# Patient Record
Sex: Male | Born: 1970 | Race: White | Hispanic: No | Marital: Married | Smoking: Never smoker
Health system: Southern US, Community
[De-identification: ages and names within clinical notes are randomized; demographics above are authoritative.]

## PROBLEM LIST (undated history)

## (undated) DIAGNOSIS — N289 Disorder of kidney and ureter, unspecified: Secondary | ICD-10-CM

## (undated) DIAGNOSIS — G43909 Migraine, unspecified, not intractable, without status migrainosus: Secondary | ICD-10-CM

## (undated) DIAGNOSIS — I10 Essential (primary) hypertension: Secondary | ICD-10-CM

## (undated) HISTORY — DX: Migraine, unspecified, not intractable, without status migrainosus: G43.909

## (undated) HISTORY — DX: Disorder of kidney and ureter, unspecified: N28.9

## (undated) HISTORY — PX: HERNIA REPAIR: SHX51

## (undated) HISTORY — DX: Essential (primary) hypertension: I10

---

## 2015-04-07 IMAGING — CR DG CHEST 1V SAME DAY
2 series · 2 of 2 positions shown · non-contrast
Comparison: 04/05/2008

CLINICAL DATA: Scoliosis.

EXAM:
CHEST - 1 VIEW SAME DAY

[PA (1 of 2)]
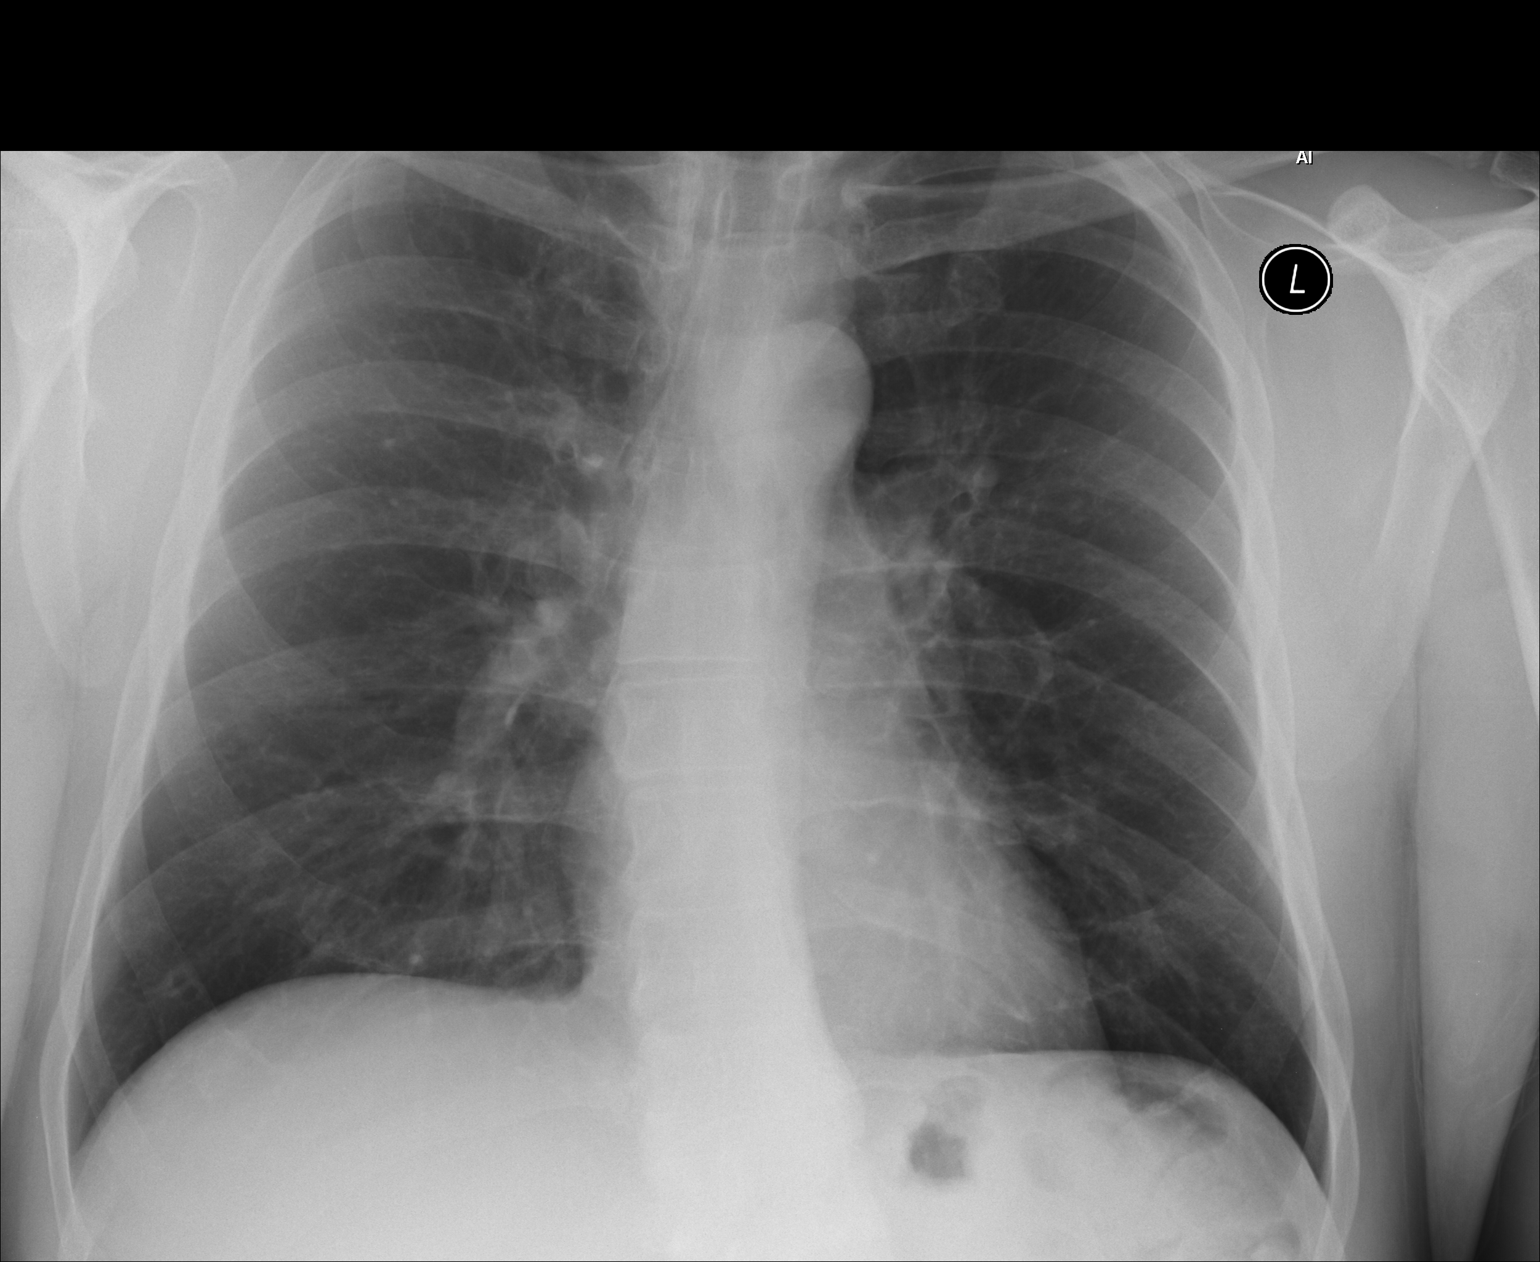

[PA (2 of 2)]
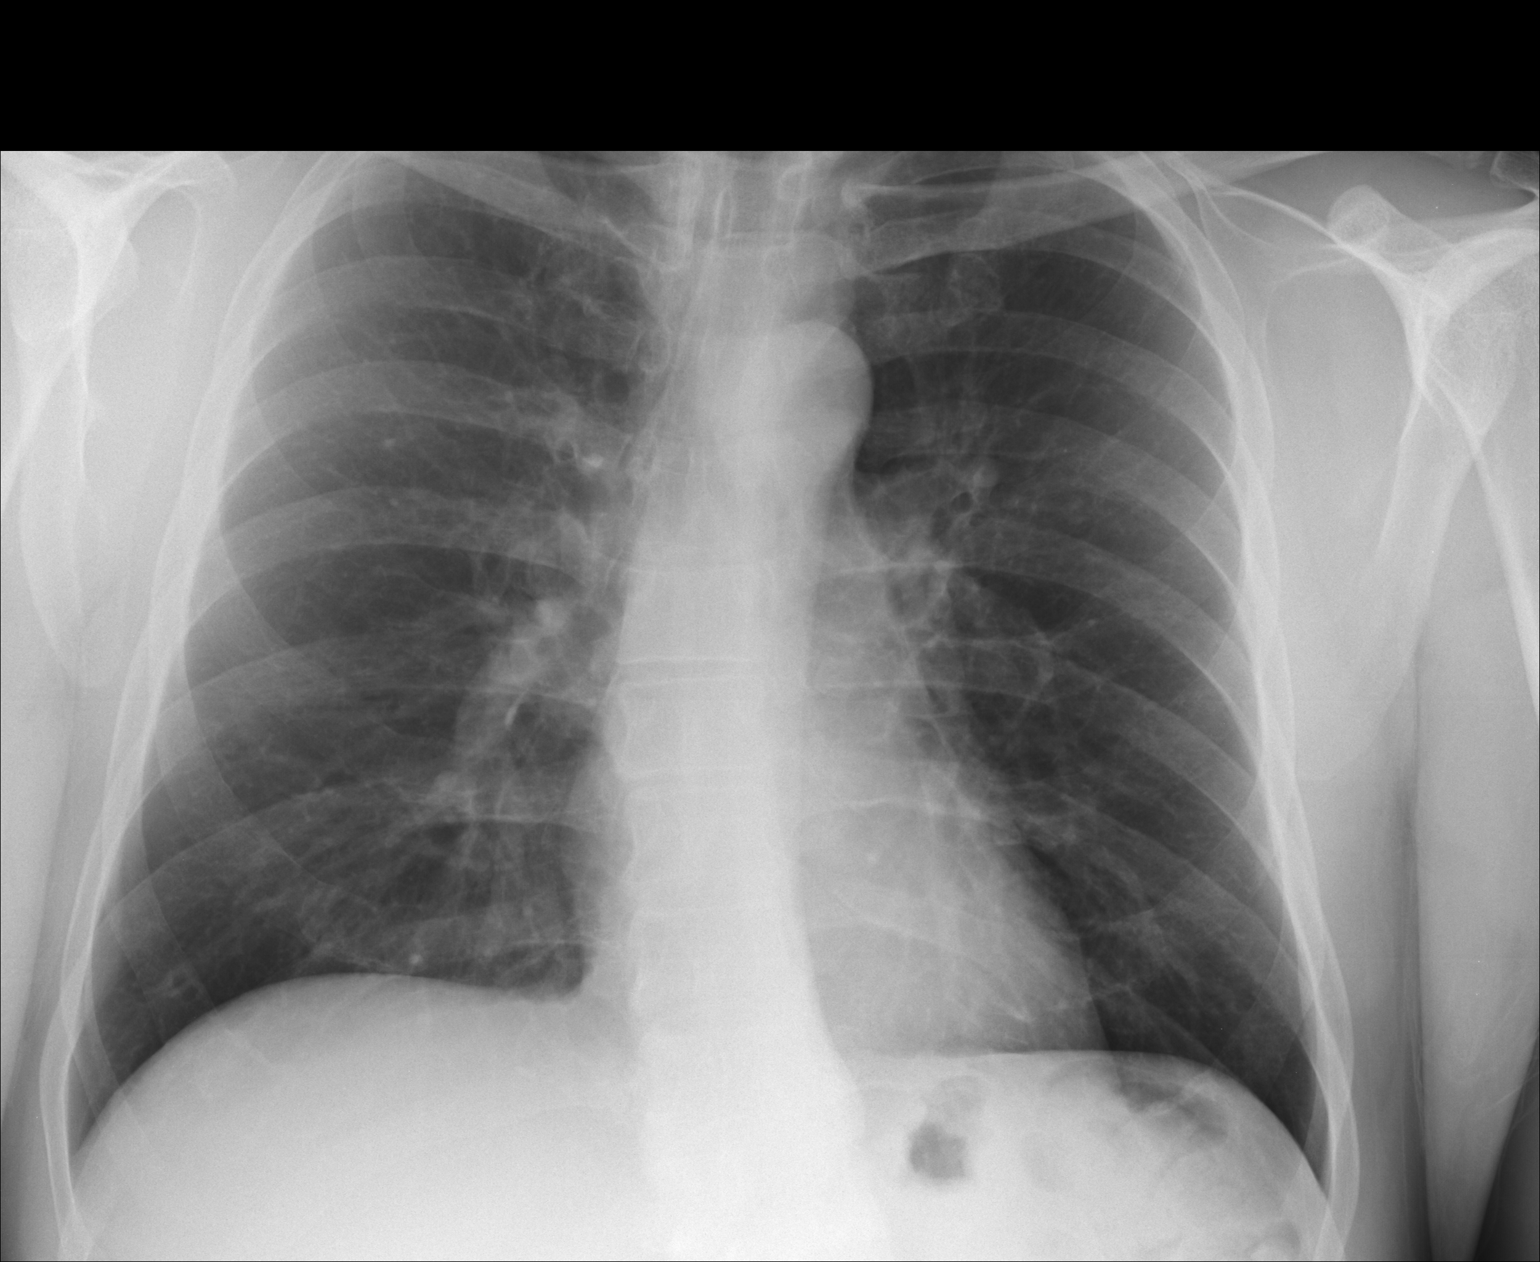

[2 of 2 positions shown; findings below may reference images not displayed]

FINDINGS: There is 16 degrees of dextroconvex thoracic scoliosis as measured
between T8-5 and T10.

Cardiac and mediastinal margins appear normal.

Request is made for comment over a "1 cm nodular area over the right
lower lung." I do not know what this is referring to. Projecting
over the right anterior seventh rib there are several vascular
markings which might resemble a nodule with a lucent center.
IMPRESSION: 1. Mild dextroconvex thoracic scoliosis.
2. I am asked to comment on a right basilar nodule, but I do not see
such a nodule. It might be worthwhile to get a chest CT since I am
not certain what the original interpretation is referring to.

## 2015-11-06 ENCOUNTER — Ambulatory Visit (INDEPENDENT_AMBULATORY_CARE_PROVIDER_SITE_OTHER): Payer: Managed Care, Other (non HMO) | Admitting: Sports Medicine

## 2015-11-06 ENCOUNTER — Ambulatory Visit (INDEPENDENT_AMBULATORY_CARE_PROVIDER_SITE_OTHER): Payer: Managed Care, Other (non HMO)

## 2015-11-06 ENCOUNTER — Encounter: Payer: Self-pay | Admitting: Sports Medicine

## 2015-11-06 DIAGNOSIS — M79674 Pain in right toe(s): Secondary | ICD-10-CM

## 2015-11-06 DIAGNOSIS — M205X1 Other deformities of toe(s) (acquired), right foot: Secondary | ICD-10-CM | POA: Diagnosis not present

## 2015-11-06 DIAGNOSIS — M25571 Pain in right ankle and joints of right foot: Secondary | ICD-10-CM

## 2015-11-06 DIAGNOSIS — M7751 Other enthesopathy of right foot: Secondary | ICD-10-CM

## 2015-11-06 MED ORDER — DICLOFENAC SODIUM 75 MG PO TBEC: 75.0000 mg | DELAYED_RELEASE_TABLET | Freq: Two times a day (BID) | ORAL | Status: DC

## 2015-11-06 MED ORDER — TRIAMCINOLONE ACETONIDE 10 MG/ML IJ SUSP: 10.0000 mg | Freq: Once | INTRAMUSCULAR | Status: AC

## 2015-11-06 NOTE — Patient Instructions (Signed)
Hallux Rigidus Hallux rigidus is a condition involving pain and a loss of motion of the first (big) toe. The pain gets worse with lifting up (extension) of the toe. This is usually due to arthritic bony bumps (spurring) of the joint at the base of the big toe.  SYMPTOMS   Pain, with lifting up of the toe.  Tenderness over the joint where the big toe meets the foot.  Redness, swelling, and warmth over the top of the base of the big toe (sometimes).  Foot pain, stiffness, and limping. CAUSES  Hallux rigidus is caused by arthritis of the joint where the big toe meets the foot. The arthritis creates a bone spur that pinches the soft tissues when the toe is extended. RISK INCREASES WITH:  Tight shoes with a narrow toe box.  Family history of foot problems.  Gout and rheumatoid and psoriatic arthritis.  History of previous toe injury, including "turf toe."  Long first toe, flat feet, and other big toe bony bumps.  Arthritis of the big toe. PREVENTION   Wear wide-toed shoes that fit well.  Tape the big toe to reduce motion and to prevent pinching of the tissues between the bone.  Maintain physical fitness:  Foot and ankle flexibility.  Muscle strength and endurance. PROGNOSIS  This condition can usually be managed with proper treatment. However, surgery is typically required to prevent the problem from recurring.  RELATED COMPLICATIONS  Injury to other areas of the foot or ankle, caused by abnormal walking in an attempt to avoid the pain felt when walking normally. TREATMENT Treatment first involves stopping the activities that aggravate your symptoms. Ice and medicine can be used to reduce the pain and inflammation. Modifications to shoes may help reduce pain, including wearing stiff-soled shoes, shoes with a wide toe box, inserting a padded donut to relieve pressure on top of the joint, or wearing an arch support. Corticosteroid injections may be given to reduce inflammation. If  nonsurgical treatment is unsuccessful, surgery may be needed. Surgical options include removing the arthritic bony spur, cutting a bone in the foot to change the arc of motion (allowing the toe to extend more), or fusion of the joint (eliminating all motion in the joint at the base of the big toe).  MEDICATION   If pain medicine is needed, nonsteroidal anti-inflammatory medicines (aspirin and ibuprofen), or other minor pain relievers (acetaminophen), are often advised.  Do not take pain medicine for 7 days before surgery.  Prescription pain relievers are usually prescribed only after surgery. Use only as directed and only as much as you need.  Ointments for arthritis, applied to the skin, may give some relief.  Injections of corticosteroids may be given to reduce inflammation. HEAT AND COLD  Cold treatment (icing) relieves pain and reduces inflammation. Cold treatment should be applied for 10 to 15 minutes every 2 to 3 hours, and immediately after activity that aggravates your symptoms. Use ice packs or an ice massage.  Heat treatment may be used before performing the stretching and strengthening activities prescribed by your caregiver, physical therapist, or athletic trainer. Use a heat pack or a warm water soak. SEEK MEDICAL CARE IF:   Symptoms get worse or do not improve in 2 weeks, despite treatment.  After surgery you develop fever, increasing pain, redness, swelling, drainage of fluids, bleeding, or increasing warmth.  New, unexplained symptoms develop. (Drugs used in treatment may produce side effects.)   This information is not intended to replace advice given to   you by your health care provider. Make sure you discuss any questions you have with your health care provider.   Document Released: 08/16/2005 Document Revised: 09/06/2014 Document Reviewed: 11/28/2008 Elsevier Interactive Patient Education 2016 [employer]  

## 2015-11-06 NOTE — Progress Notes (Addendum)
Patient ID: Cody Lee, male   DOB: 1971-05-25, 45 y.o.   MRN: [id number]  Subjective: Cody LaundryDavid Lee is a 45 y.o. male patient who presents to office for evaluation of right foot pain. Patient complains of progressive pain especially over the last few months in the right big toe joint; worse with ambulation or sports; plays tennis. Ranks pain 6/10 and is now interferring with daily activities and is becoming more constant. Patient has tried nothing for pain. Patient denies any other pedal complaints. Denies injury/trip/fall/sprain/any other possible causative factors.   There are no active problems to display for this patient.   No current outpatient prescriptions on file prior to visit.   No current facility-administered medications on file prior to visit.    Not on File  Objective:  General: Alert and oriented x3 in no acute distress  Dermatology: No open lesions bilateral lower extremities, no webspace macerations, no ecchymosis bilateral, all nails x 10 are well manicured.  Vascular: Dorsalis Pedis and Posterior Tibial pedal pulses palpable, Capillary Fill Time 3 seconds,(+) pedal hair growth bilateral, no edema bilateral lower extremities, Temperature gradient within normal limits.  Neurology: [name] sensation intact via light touch bilateral, (- )Tinels sign bilateral.   Musculoskeletal: Mild tenderness with palpation at right 1st MTPJ with pain on end range plantarflexion has 10deg df and 5 deg plantarflexion on right, No pain to Left 1st MTPJ,No pain with calf compression bilateral. Strength within normal limits in all groups bilateral.   Xrays  Right Foot   Impression: 1st MTPJ space narrowing with dorsal osteophyte Significant for arthritis. There is no fracture or dislocation. Soft tissues within normal limits. No foreign bodies or any other acute findings.   Assessment and Plan: Problem List Items Addressed This Visit    None    Visit Diagnoses    Capsulitis of toe, right     -  Primary    Relevant Medications    cyclobenzaprine (FLEXERIL) 5 MG tablet    diclofenac (VOLTAREN) 75 MG EC tablet    triamcinolone acetonide (KENALOG) 10 MG/ML injection 10 mg    Other Relevant Orders    DG Foot 2 Views Right    Hallux limitus of right foot        Relevant Medications    diclofenac (VOLTAREN) 75 MG EC tablet    triamcinolone acetonide (KENALOG) 10 MG/ML injection 10 mg    Toe joint pain, right        Relevant Medications    diclofenac (VOLTAREN) 75 MG EC tablet    triamcinolone acetonide (KENALOG) 10 MG/ML injection 10 mg       -Complete examination performed -Xrays reviewed -Discussed treatement options , hallux limitus -After oral consent and aseptic prep, injected a mixture containing 1 ml of 2%  plain lidocaine, 1 ml 0.5% plain marcaine, 0.5 ml of kenalog 10 and 0.5 ml of dexamethasone phosphate into Right 1st MTP. Post-injection care discussed with patient.  -Rx diclofenac -Recommend daily icing and gentle range of motion exercises -Gave offloading metatarsal pad and instructed on use. If this works well, patient patient will benefit from orthotic management -Recommend good supportive shoes for foot type -Advised patient to consider surgery if this current treatment plan fails to offer any relief due to patient's high functional status and sports/tennis. Discussed cheilectomy with decompression first metatarsal osteotomy.  -Patient to return to office in 3 weeks or sooner if condition worsens.  Asencion Islamitorya Cheryal Salas, DPM

## 2015-11-27 ENCOUNTER — Ambulatory Visit (INDEPENDENT_AMBULATORY_CARE_PROVIDER_SITE_OTHER): Payer: Managed Care, Other (non HMO) | Admitting: Sports Medicine

## 2015-11-27 ENCOUNTER — Encounter: Payer: Self-pay | Admitting: Sports Medicine

## 2015-11-27 DIAGNOSIS — M79674 Pain in right toe(s): Secondary | ICD-10-CM

## 2015-11-27 DIAGNOSIS — M205X1 Other deformities of toe(s) (acquired), right foot: Secondary | ICD-10-CM | POA: Diagnosis not present

## 2015-11-27 DIAGNOSIS — M25571 Pain in right ankle and joints of right foot: Secondary | ICD-10-CM

## 2015-11-27 DIAGNOSIS — M7751 Other enthesopathy of right foot: Secondary | ICD-10-CM

## 2015-11-27 NOTE — Progress Notes (Signed)
Patient ID: Cody Lee, male   DOB: [birth year], 45 y.o.   MRN: [id number]  Subjective: Cody Lee is a 45 y.o. male patient who returns to office for follow-up evaluation of right big toe joint pain. Patient had an injection about 3 weeks ago and reports that the injection lasted until 11/14/2015 and up until this point He had no pain or symptoms at all. States now has pain with running or high impact activities and little to no pain with daily activities such as light walking. Patient states that he cannot tell a difference if the Voltaren medication helped orally. States that he did not ice and is indifferent about if the pad really helped him. Patient denies any other pedal complaints at this time.    There are no active problems to display for this patient.   Current Outpatient Prescriptions on File Prior to Visit  Medication Sig Dispense Refill  . cyclobenzaprine (FLEXERIL) 5 MG tablet take 1 tablet by mouth at bedtime if needed for back SPASM  0  . diclofenac (VOLTAREN) 75 MG EC tablet Take 1 tablet (75 mg total) by mouth 2 (two) times daily. 30 tablet 0  . propranolol ER (INDERAL LA) 60 MG 24 hr capsule take 1 capsule by mouth every evening for headache PREVENTION  0   Current Facility-Administered Medications on File Prior to Visit  Medication Dose Route Frequency Provider Last Rate Last Dose  . triamcinolone acetonide (KENALOG) 10 MG/ML injection 10 mg  10 mg Other Once [employer], DPM        No Known Allergies  Objective:  General: Alert and oriented x3 in no acute distress  Dermatology: No open lesions bilateral lower extremities, no webspace macerations, no ecchymosis bilateral, all nails x 10 are well manicured.  Vascular: Dorsalis Pedis and Posterior Tibial pedal pulses palpable, Capillary Fill Time 3 seconds,(+) pedal hair growth bilateral, no edema bilateral lower extremities, Temperature gradient within normal limits.  Neurology: Michaell Cowing sensation intact via light  touch bilateral, (- )Tinels sign bilateral.   Musculoskeletal: Decreased tenderness with palpation at right 1st MTPJ with mild pain on end range plantarflexion has 10deg dorsiflexion and 5 deg plantarflexion on right, No pain to Left, No pain with calf compression bilateral. Strength within normal limits in all groups bilateral.   Assessment and Plan: Problem List Items Addressed This Visit    None    Visit Diagnoses    Hallux limitus of right foot    -  Primary    Capsulitis of toe, right        Toe joint pain, right           -Complete examination performed -Discussed treatement options and longterm plan of care for hallux limitus/rigidus with arthritis -Patient declined repeat injection. States that his pain is not as bad as it was before and as much tolerable.  -Patient declined another round of anti-inflammatories. States that he couldn't tell a difference when he was on them. -Recommend daily icing and gentle range of motion exercises as tolerated -Applied a quarter-inch felt Morton's extension to his shoe foot bed to mimic what an orthotic would do for the patient. If this works well, patient patient will benefit from orthotic management with Morton's extension -Recommend good supportive shoes for foot type and activity -Advised patient to consider surgery if this current treatment plan fails to offer any relief due to patient's high functional status with running/sports/tennis. Re-discussed cheilectomy with decompression first metatarsal osteotomy.  -Patient to return to office  as needed or sooner if condition worsens.  Asencion Islamitorya Hiilani Jetter, DPM

## 2016-07-29 ENCOUNTER — Encounter: Payer: Self-pay | Admitting: Sports Medicine

## 2016-07-29 ENCOUNTER — Ambulatory Visit (INDEPENDENT_AMBULATORY_CARE_PROVIDER_SITE_OTHER): Payer: Managed Care, Other (non HMO) | Admitting: Sports Medicine

## 2016-07-29 DIAGNOSIS — M205X1 Other deformities of toe(s) (acquired), right foot: Secondary | ICD-10-CM | POA: Diagnosis not present

## 2016-07-29 DIAGNOSIS — M7751 Other enthesopathy of right foot: Secondary | ICD-10-CM

## 2016-07-29 DIAGNOSIS — M25571 Pain in right ankle and joints of right foot: Secondary | ICD-10-CM

## 2016-07-29 MED ORDER — TRIAMCINOLONE ACETONIDE 40 MG/ML IJ SUSP: 20.0000 mg | Freq: Once | INTRAMUSCULAR | Status: AC

## 2016-07-29 NOTE — Progress Notes (Signed)
Patient ID: Cody Lee, male   DOB: [birth year], 45 y.o.   MRN: [id number]  Subjective: Cody Lee is a 45 y.o. male patient who returns to office for follow-up evaluation of right big toe joint pain. Patient requests another injection states that this has helped him the most. Patient denies any other pedal complaints at this time.    There are no active problems to display for this patient.   Current Outpatient Prescriptions on File Prior to Visit  Medication Sig Dispense Refill  . cyclobenzaprine (FLEXERIL) 5 MG tablet take 1 tablet by mouth at bedtime if needed for back SPASM  0  . diclofenac (VOLTAREN) 75 MG EC tablet Take 1 tablet (75 mg total) by mouth 2 (two) times daily. 30 tablet 0  . propranolol ER (INDERAL LA) 60 MG 24 hr capsule take 1 capsule by mouth every evening for headache PREVENTION  0   Current Facility-Administered Medications on File Prior to Visit  Medication Dose Route Frequency Provider Last Rate Last Dose  . triamcinolone acetonide (KENALOG) 10 MG/ML injection 10 mg  10 mg Other Once [employer] Xiara Knisley, DPM        No Known Allergies  Objective:  General: Alert and oriented x3 in no acute distress  Dermatology: No open lesions bilateral lower extremities, no webspace macerations, no ecchymosis bilateral, all nails x 10 are well manicured.  Vascular: Dorsalis Pedis and Posterior Tibial pedal pulses palpable, Capillary Fill Time 3 seconds,(+) pedal hair growth bilateral, no edema bilateral lower extremities, Temperature gradient within normal limits.  Neurology: [name] sensation intact via light touch bilateral, (- )Tinels sign bilateral.   Musculoskeletal: Mild tenderness with palpation at right 1st MTPJ with mild pain on end range plantarflexion has 10deg dorsiflexion and 5 deg plantarflexion on right, No pain to Left, No pain with calf compression bilateral. Strength within normal limits in all groups bilateral.   Assessment and Plan: Problem List Items Addressed  This Visit    None    Visit Diagnoses    Hallux limitus of right foot    -  Primary   Relevant Medications   triamcinolone acetonide (KENALOG-40) injection 20 mg   Capsulitis of toe, right       Relevant Medications   triamcinolone acetonide (KENALOG-40) injection 20 mg   Toe joint pain, right       Relevant Medications   triamcinolone acetonide (KENALOG-40) injection 20 mg      -Complete examination performed -Discussed treatement options and longterm plan of care for hallux limitus/rigidus with arthritis -After oral consent and aseptic prep, injected a mixture containing 1 ml of 2% plain lidocaine, 1 ml 0.5% plain marcaine, 0.5 ml of kenalog 40 and 0.5 ml of dexamethasone phosphate into Right 1st MTPJ without complication. Post-injection care discussed with patient.  -Recommend daily icing and gentle range of motion exercises as tolerated -Recommend good supportive shoes for foot type and activity and felt extension on insert -Advised patient to consider surgery if this current treatment plan fails to offer any relief due to patient's high functional status with running/sports/tennis. Re-discussed cheilectomy with decompression first metatarsal osteotomy.  -Patient to return to office as needed or sooner if condition worsens.  Asencion Islamitorya [employer], DPM

## 2017-02-18 ENCOUNTER — Ambulatory Visit (INDEPENDENT_AMBULATORY_CARE_PROVIDER_SITE_OTHER): Payer: Managed Care, Other (non HMO) | Admitting: Sports Medicine

## 2017-02-18 DIAGNOSIS — M7751 Other enthesopathy of right foot: Secondary | ICD-10-CM

## 2017-02-18 DIAGNOSIS — M25571 Pain in right ankle and joints of right foot: Secondary | ICD-10-CM

## 2017-02-18 DIAGNOSIS — M205X1 Other deformities of toe(s) (acquired), right foot: Secondary | ICD-10-CM

## 2017-02-18 MED ORDER — TRIAMCINOLONE ACETONIDE 10 MG/ML IJ SUSP: 10.0000 mg | Freq: Once | INTRAMUSCULAR | Status: AC

## 2017-02-18 NOTE — Progress Notes (Signed)
Patient ID: Cody Lee, male   DOB: Jul 24, 1971, 46 y.o.   MRN: [id number]  Subjective: Cody Lee is a 46 y.o. male patient who returns to office for follow-up evaluation of right big toe joint pain. Patient requests another injection states that this has helped him the most. Reports that pain started to come back last month, especially with tennis. However, over the last 3 days; Pain has eased off tremendously because he has been working complaint with icing. Patient denies any other pedal complaints at this time.    There are no active problems to display for this patient.   Current Outpatient Prescriptions on File Prior to Visit  Medication Sig Dispense Refill  . cyclobenzaprine (FLEXERIL) 5 MG tablet take 1 tablet by mouth at bedtime if needed for back SPASM  0  . diclofenac (VOLTAREN) 75 MG EC tablet Take 1 tablet (75 mg total) by mouth 2 (two) times daily. 30 tablet 0  . propranolol ER (INDERAL LA) 60 MG 24 hr capsule take 1 capsule by mouth every evening for headache PREVENTION  0   Current Facility-Administered Medications on File Prior to Visit  Medication Dose Route Frequency Provider Last Rate Last Dose  . triamcinolone acetonide (KENALOG) 10 MG/ML injection 10 mg  10 mg Other Once [employer]iStover, Fuquan Wilson, DPM      . triamcinolone acetonide (KENALOG-40) injection 20 mg  20 mg Other Once Asencion IslamStover, Tiny Rietz, DPM        No Known Allergies  Objective:  General: Alert and oriented x3 in no acute distress  Dermatology: No open lesions bilateral lower extremities, no webspace macerations, no ecchymosis bilateral, all nails x 10 are well manicured.  Vascular: Dorsalis Pedis and Posterior Tibial pedal pulses palpable, Capillary Fill Time 3 seconds,(+) pedal hair growth bilateral, no edema bilateral lower extremities, Temperature gradient within normal limits.  Neurology: [name] sensation intact via light touch bilateral, (- )Tinels sign bilateral.   Musculoskeletal: Mild tenderness with  palpation at right 1st MTPJ with mild pain on end range plantarflexion has 10deg dorsiflexion and 5 deg plantarflexion on right, No pain to Left, No pain with calf compression bilateral. Strength within normal limits in all groups bilateral.   Assessment and Plan: Problem List Items Addressed This Visit    None    Visit Diagnoses    Capsulitis of toe, right    -  Primary   Relevant Medications   triamcinolone acetonide (KENALOG) 10 MG/ML injection 10 mg (Start on 02/18/2017  5:45 PM)   Hallux limitus of right foot       Relevant Medications   triamcinolone acetonide (KENALOG) 10 MG/ML injection 10 mg (Start on 02/18/2017  5:45 PM)   Toe joint pain, right       Relevant Medications   triamcinolone acetonide (KENALOG) 10 MG/ML injection 10 mg (Start on 02/18/2017  5:45 PM)      -Complete examination performed -Discussed treatement options and longterm plan of care for hallux limitus/rigidus with arthritis -After oral consent and aseptic prep, injected a mixture containing 1 ml of 2% plain lidocaine, 1 ml 0.5% plain marcaine, 0.5 ml of kenalog 10 and 0.5 ml of dexamethasone phosphate into Right 1st MTPJ without complication. Post-injection care discussed with patient.  -Recommend daily icing and gentle range of motion exercises as tolerated -Recommend good supportive shoes for foot type and activity and to continue with felt extension on insert -Advised patient again to consider surgery if this current treatment plan fails to offer any relief due to patient's  high functional status with running/sports/tennis. Re-discussed cheilectomy with decompression first metatarsal osteotomy.  -Patient to return to office as needed or sooner if condition worsens.  Asencion Islam, DPM

## 2017-02-22 ENCOUNTER — Encounter: Payer: Self-pay | Admitting: Neurology

## 2017-05-27 ENCOUNTER — Ambulatory Visit (INDEPENDENT_AMBULATORY_CARE_PROVIDER_SITE_OTHER): Payer: Managed Care, Other (non HMO) | Admitting: Neurology

## 2017-05-27 ENCOUNTER — Encounter: Payer: Self-pay | Admitting: Neurology

## 2017-05-27 VITALS — BP 130/88 | HR 62 | Ht 76.0 in | Wt 201.5 lb

## 2017-05-27 DIAGNOSIS — R519 Headache, unspecified: Secondary | ICD-10-CM

## 2017-05-27 DIAGNOSIS — R51 Headache: Secondary | ICD-10-CM | POA: Diagnosis not present

## 2017-05-27 DIAGNOSIS — R0683 Snoring: Secondary | ICD-10-CM | POA: Insufficient documentation

## 2017-05-27 DIAGNOSIS — G43009 Migraine without aura, not intractable, without status migrainosus: Secondary | ICD-10-CM | POA: Insufficient documentation

## 2017-05-27 DIAGNOSIS — G4440 Drug-induced headache, not elsewhere classified, not intractable: Secondary | ICD-10-CM | POA: Insufficient documentation

## 2017-05-27 DIAGNOSIS — M542 Cervicalgia: Secondary | ICD-10-CM

## 2017-05-27 DIAGNOSIS — R202 Paresthesia of skin: Secondary | ICD-10-CM | POA: Diagnosis not present

## 2017-05-27 MED ORDER — TOPIRAMATE ER 25 MG PO CAP24: 1.0000 | ORAL_CAPSULE | Freq: Every day | ORAL | 0 refills | Status: DC

## 2017-05-27 MED ORDER — TOPIRAMATE ER 50 MG PO CAP24: 50.0000 mg | ORAL_CAPSULE | Freq: Every day | ORAL | 5 refills | Status: DC

## 2017-05-27 MED ORDER — ELETRIPTAN HYDROBROMIDE 20 MG PO TABS: 20.0000 mg | ORAL_TABLET | ORAL | 5 refills | Status: DC | PRN

## 2017-05-27 MED ORDER — TOPIRAMATE ER 50 MG PO CAP24: 1.0000 | ORAL_CAPSULE | Freq: Every day | ORAL | 0 refills | Status: DC

## 2017-05-27 NOTE — Progress Notes (Signed)
[facility] Clinic Note - Initial Visit   Date: 05/27/17  [name] MRN: [id number] DOB: [birth year]   Dear Dr. Welton Flakes:  Thank you for your kind referral of Cody Lee for consultation of left hand paresthesias. Although his history is well known to you, please allow [name] to reiterate it for the purpose of our medical record. The patient was accompanied to the clinic by self.   History of Present Illness: Cody Lee is a 46 y.o. right-handed Caucasian male with migraine, anxiety, stage 2 CKD, and hypertension presenting for evaluation of left arm paresthesias and pain.   Over the past year, he had had two spells of tingling and numbness of the left hand which initially lasted 3 months, then in early May, he had numbness of the hands with associated shooting pain around the shoulder radiating down into the elbow.  The numbness lasted about 3 months.  He denies any weakness of the hands.   He has noticed that when resting his elbow on the door when driving, his numbness is triggered.  He also notices that extending his arm above his shoulder can trigger the pain.  Sometimes, he has neck pain.   He has long history of migraines starting his early teen years.  He saw neurology in high school and was started on muscle relaxants.  MRI brain at the time was normal.  Since then, he uses excedrin almost daily.  Over the past several years, his headaches have increased in intensity and frequency.  He has two types of headaches (1) dull achy daily headaches and (2) episodic migraines.  Daily headaches are dull and start from when he wakes up in the morning and lasts most of the day.  Pain is ranked as 3/10. Migraines are bitemporal throbbing pain, occur about 1-2 times per month, lasting 2-3 days.  Intensity is maximum within two hours.  He denies any nausea, vomiting, phonophobia, and only mild photophobia.  He denies any preceding vision changes, numbness/tingling, or warning  signs.  Pain is ranked 9/10.   Previous preventative medications have included: topirmate, propranol, nortriptyline, none of which provided any relief.  He currently takes Excedrin tablets daily for over 1.5 year. He has tried tylenol, Aleve, and ibuprofen which did not provide any benefit.   He had stage 2 CKD, likely from overuse of NSAIDs.  Notes from his PCP's office were reviewed and summarized above.   Past Medical History:  Diagnosis Date  . High blood pressure   . Kidney disease   . Migraines     Past Surgical History:  Procedure Laterality Date  . HERNIA REPAIR       Medications:  Outpatient Encounter Prescriptions as of 05/27/2017  Medication Sig Note  . citalopram (CELEXA) 20 MG tablet    . lisinopril (PRINIVIL,ZESTRIL) 10 MG tablet    . eletriptan (RELPAX) 20 MG tablet Take 1 tablet (20 mg total) by mouth as needed for migraine or headache. May repeat in 2 hours if headache persists or recurs.   . Topiramate ER (TROKENDI XR) 50 MG CP24 Take 50 mg by mouth daily.   . [DISCONTINUED] cyclobenzaprine (FLEXERIL) 5 MG tablet take 1 tablet by mouth at bedtime if needed for back SPASM 11/06/2015: Received from: External Pharmacy  . [DISCONTINUED] diclofenac (VOLTAREN) 75 MG EC tablet Take 1 tablet (75 mg total) by mouth 2 (two) times daily.   . [DISCONTINUED] propranolol ER (INDERAL LA) 60 MG 24 hr capsule take 1 capsule by mouth  every evening for headache PREVENTION 11/06/2015: Received from: External Pharmacy   Facility-Administered Encounter Medications as of 05/27/2017  Medication  . triamcinolone acetonide (KENALOG) 10 MG/ML injection 10 mg  . triamcinolone acetonide (KENALOG) 10 MG/ML injection 10 mg  . triamcinolone acetonide (KENALOG-40) injection 20 mg     Allergies: No Known Allergies  Family History: Family History  Problem Relation Age of Onset  . Healthy Mother   . High blood pressure Father   . Healthy Sister   . Colon cancer Maternal Grandmother   . Stroke  Maternal Grandfather   . Aneurysm Paternal Grandmother        Brain  . Stroke Paternal Grandfather     Social History: Social History  Substance Use Topics  . Smoking status: Never Smoker  . Smokeless tobacco: Never Used  . Alcohol use No   Social History   Social History Narrative   Lives with wife and 2 children in a one story home.     Works as a Photographer.  Education: Scientist, water quality.      Review of Systems:  CONSTITUTIONAL: No fevers, chills, night sweats, or weight loss.   EYES: No visual changes or eye pain ENT: No hearing changes.  No history of nose bleeds.   RESPIRATORY: No cough, wheezing and shortness of breath.   CARDIOVASCULAR: Negative for chest pain, and palpitations.   GI: Negative for abdominal discomfort, blood in stools or black stools.  No recent change in bowel habits.   GU:  No history of incontinence.   MUSCLOSKELETAL: No history of joint pain or swelling.  No myalgias.   SKIN: Negative for lesions, rash, and itching.   HEMATOLOGY/ONCOLOGY: Negative for prolonged bleeding, bruising easily, and swollen nodes.  No history of cancer.   ENDOCRINE: Negative for cold or heat intolerance, polydipsia or goiter.   PSYCH:  No depression +anxiety symptoms.   NEURO: As Above.   Vital Signs:  BP 130/88   Pulse 62   Ht  (1.93 m)   Wt 201 lb 8 oz (91.4 kg)   SpO2 96%   BMI 24.53 kg/m    General Medical Exam:   General:  Well appearing, comfortable.   Eyes/ENT: see cranial nerve examination.   Neck: No masses appreciated.  Full range of motion without tenderness.  No carotid bruits. Respiratory:  Clear to auscultation, good air entry bilaterally.   Cardiac:  Regular rate and rhythm, no murmur.   Extremities:  No deformities, edema, or skin discoloration.  Skin:  No rashes or lesions.  Neurological Exam: MENTAL STATUS including orientation to time, place, person, recent and remote memory, attention span and concentration, language, and fund of knowledge is  normal.  Speech is not dysarthric.  CRANIAL NERVES: II:  No visual field defects.  Unremarkable fundi.   III-IV-VI: Pupils equal round and reactive to light.  Normal conjugate, extra-ocular eye movements in all directions of gaze.  No nystagmus.  No ptosis.   V:  Normal facial sensation.     VII:  Normal facial symmetry and movements.   VIII:  Normal hearing and vestibular function.   IX-X:  Normal palatal movement.   XI:  Normal shoulder shrug and head rotation.   XII:  Normal tongue strength and range of motion, no deviation or fasciculation.  MOTOR:  No atrophy, fasciculations or abnormal movements.  No pronator drift.  Tone is normal.    Right Upper Extremity:    Left Upper Extremity:    Deltoid  5/5  Deltoid  5/5   Biceps  5/5   Biceps  5/5   Triceps  5/5   Triceps  5/5   Wrist extensors  5/5   Wrist extensors  5/5   Wrist flexors  5/5   Wrist flexors  5/5   Finger extensors  5/5   Finger extensors  5/5   Finger flexors  5/5   Finger flexors  5/5   Dorsal interossei  5/5   Dorsal interossei  5/5   Abductor pollicis  5/5   Abductor pollicis  5/5   Tone (Ashworth scale)  0  Tone (Ashworth scale)  0   Right Lower Extremity:    Left Lower Extremity:    Hip flexors  5/5   Hip flexors  5/5   Hip extensors  5/5   Hip extensors  5/5   Knee flexors  5/5   Knee flexors  5/5   Knee extensors  5/5   Knee extensors  5/5   Dorsiflexors  5/5   Dorsiflexors  5/5   Plantarflexors  5/5   Plantarflexors  5/5   Toe extensors  5/5   Toe extensors  5/5   Toe flexors  5/5   Toe flexors  5/5   Tone (Ashworth scale)  0  Tone (Ashworth scale)  0   MSRs:  Reflexes are 2+/4 throughout.  Plantars are downgoing.  SENSORY:  Normal and symmetric perception of light touch, pinprick, vibration, and proprioception. Tinel's sign is negative at the left wrist and elbow.  COORDINATION/GAIT: Normal finger-to- nose-finger and heel-to-shin.  Intact rapid alternating movements bilaterally. Gait narrow based and  stable. Tandem and stressed gait intact.    IMPRESSION: 1.  Medication overuse headaches.  Patient counseled at length to discontinue excedrin as well as other OTC abortive medications as this is contributing to the severity of his headaches and especially with his stage 2 CKD, need to avoid NSAIDs.  Instructed him to only treat headache pain ranked >5/10 with tylenol  2.  Chronic daily headaches   - He has previously tried topiramate (paresthesias), propranol, nortriptyline, magnesium  - We discussed options of Botox and Aimovig going forward, but he prefers tablets  - Start Trokendi  x 1 week, then increase to  daily - as the extended release formulation has reduced side effects of paresthesias and cognitive changes  - With his morning headaches and history of snoring, proceed with sleep study to assess for OSA   - Encouraged to keep headache daily 3.  Episodic migraine without aura  - Previously tried:  imitrex  - Start Relpax  at headache onset 4.  Left arm paresthesias   - ?cervical radiculopathy vs. ulnar nerve entrapment at the elbow/CTS; with symptoms triggered by elbow flexion and compression at the elbow, ulnar nerve impingement is likely, but he also has neck pain with radicular symptoms from the shoulder suggestive of cervical radiculopathy.    - NCS/EMG will be ordered to better localize symptoms  - Start neck physiotherapy for neck pain, headaches, and left arm paresthesias 5.  Stage 2 CKD, followed by nephrology  Return to clinic in 3 months.   The duration of this appointment visit was 60 minutes of face-to-face time with the patient.  Greater than 50% of this time was spent in counseling, explanation of diagnosis, planning of further management, and coordination of care.   Thank you for allowing me to participate in patient's care.  If I can answer any additional questions, I would be  pleased to do so.    Sincerely,    Avry Monteleone K. Posey Pronto, DO

## 2017-05-27 NOTE — Patient Instructions (Addendum)
1.  STOP EXCEDRIN  2.  Start Trokendi  daily x 1 week, then increase to Trokendi  daily.  Please use the prescription coupon card  3.  Start Relpax for severe migraines  4.  Start physical therapy for your neck  5.  Sleep study  6.  NCS/EMG of the left arm  Return to clinic in 3 months

## 2017-05-27 NOTE — Addendum Note (Signed)
Addended by: Vivien Rota on: 1/61/0960 10:34 AM   Modules accepted: Orders

## 2017-06-01 ENCOUNTER — Ambulatory Visit: Payer: Managed Care, Other (non HMO) | Admitting: Sports Medicine

## 2017-06-14 ENCOUNTER — Ambulatory Visit (INDEPENDENT_AMBULATORY_CARE_PROVIDER_SITE_OTHER): Payer: Managed Care, Other (non HMO) | Admitting: Neurology

## 2017-06-14 ENCOUNTER — Telehealth: Payer: Self-pay | Admitting: Neurology

## 2017-06-14 DIAGNOSIS — R51 Headache: Secondary | ICD-10-CM

## 2017-06-14 DIAGNOSIS — R519 Headache, unspecified: Secondary | ICD-10-CM

## 2017-06-14 DIAGNOSIS — M542 Cervicalgia: Secondary | ICD-10-CM

## 2017-06-14 DIAGNOSIS — G43009 Migraine without aura, not intractable, without status migrainosus: Secondary | ICD-10-CM

## 2017-06-14 DIAGNOSIS — G5622 Lesion of ulnar nerve, left upper limb: Secondary | ICD-10-CM

## 2017-06-14 DIAGNOSIS — G4440 Drug-induced headache, not elsewhere classified, not intractable: Secondary | ICD-10-CM

## 2017-06-14 DIAGNOSIS — R0683 Snoring: Secondary | ICD-10-CM

## 2017-06-14 NOTE — Procedures (Signed)
[facility]  [address] [place], [address]  [place], [place] [address] Tel: [phone] Fax:  [phone] Test Date:  06/14/2017  Patient: Cody Lee DOB: 1971-07-20 Physician: Nita Sickle, DO  Sex: Male Height:  Ref Phys: Nita Sickle, DO  ID#: [id number] Temp: 33.2C Technician:    Patient Complaints: This is a 46 year-old man referred for evaluation of left arm paresthesias.  NCV & EMG Findings: Extensive electrodiagnostic testing of the left upper extremity shows:  1. Left ulnar sensory nerve showed prolonged distal peak latency (3.3 ms).  Left median, dorsal ulnar cutaneous, and mixed palmar sensory responses are within normal limits. 2. Left ulnar motor response at the abductor digiti minimi shows reduced amplitude (6.4 mV) and decreased conduction velocity (A Elbow-B Elbow, 32 m/s).  The left Ulnar (FDI) motor nerve shows decreased conduction velocity (A Elbow-B Elbow, 36 m/s).  Left median motor responses within normal limits.  3. Chronic motor axon loss changes are seen affecting the first dorsal interosseous and abductor digit minimi muscles, without accompanied active denervation.   Impression: Left ulnar neuropathy with slowing across the elbow, demyelinating and axon loss in type. Overall, these findings are moderate in degree electrically.   ___________________________ Nita Sickle, DO    Nerve Conduction Studies Anti Sensory Summary Table   Stim Site NR Peak (ms) Norm Peak (ms) P-T Amp (V) Norm P-T Amp  Left DorsCutan Anti Sensory (Dorsum 5th MC)  33.2C  Wrist    1.7 <3.1 16.1 >12  Left Median Anti Sensory (2nd Digit)  33.2C  Wrist    3.3 <3.4 25.8 >20  Left Ulnar Anti Sensory (5th Digit)  33.2C  Wrist    3.3 <3.1 19.8 >12   Motor Summary Table   Stim Site NR Onset (ms) Norm Onset (ms) O-P Amp (mV) Norm O-P Amp Site1 Site2 Delta-0 (ms) Dist (cm) Vel (m/s) Norm Vel (m/s)  Left Median Motor (Abd Poll Brev)  33.2C  Wrist    3.1 <3.9 9.0 >6  Elbow Wrist 5.7 34.0 60 >50  Elbow    8.8  8.9         Left Ulnar Motor (Abd Dig Minimi)  33.2C  Wrist    2.8 <3.1 6.4 >7 B Elbow Wrist 4.3 24.0 56 >50  B Elbow    7.1  5.5  A Elbow B Elbow 3.1 10.0 32 >50  A Elbow    10.2  5.5         Left Ulnar (FDI) Motor (1st DI)  33.2C  Wrist    4.2 <4.3 10.6 >7 B Elbow Wrist 4.6 24.0 52 >50  B Elbow    8.8  10.0  A Elbow B Elbow 2.8 10.0 36 >50  A Elbow    11.6  9.2          Comparison Summary Table   Stim Site NR Peak (ms) Norm Peak (ms) P-T Amp (V) Site1 Site2 Delta-P (ms) Norm Delta (ms)  Left Median/Ulnar Palm Comparison (Wrist - 8cm)  33.2C  Median Palm    1.9 <2.2 34.5 Median Palm Ulnar Palm 0.1   Ulnar Palm    1.8 <2.2 8.9       EMG   Side Muscle Ins Act Fibs Psw Fasc Number Recrt Dur Dur. Amp Amp. Poly Poly. Comment  Left 1stDorInt Nml Nml Nml Nml 1- Rapid Some 1+ Some 1+ Nml Nml N/A  Left ABD Dig Min Nml Nml Nml Nml 1- Rapid Some 1+ Some 1+ Nml  Nml N/A  Left FlexDigProf 4,5 Nml Nml Nml Nml Nml Nml Nml Nml Nml Nml Nml Nml N/A  Left Ext Indicis Nml Nml Nml Nml Nml Nml Nml Nml Nml Nml Nml Nml N/A  Left PronatorTeres Nml Nml Nml Nml Nml Nml Nml Nml Nml Nml Nml Nml N/A  Left Biceps Nml Nml Nml Nml Nml Nml Nml Nml Nml Nml Nml Nml N/A  Left Triceps Nml Nml Nml Nml Nml Nml Nml Nml Nml Nml Nml Nml N/A  Left Deltoid Nml Nml Nml Nml Nml Nml Nml Nml Nml Nml Nml Nml N/A      Waveforms:

## 2017-06-14 NOTE — Telephone Encounter (Signed)
Results of EMG discussed with patient which shows left ulnar neuropathy across the elbow.  Strategies to minimize trauma and stretching to the nerve discussed.  Recommend using a soft elbow pad and using it as a elbow block at night.  Mahagony Grieb K. Allena Katz, DO

## 2017-08-11 NOTE — Progress Notes (Signed)
Follow-up Visit   Date: 08/12/17    Cody Lee MRN: [id number] DOB: 02-Feb-1971   Interim History: Cody LaundryDavid Lee is a 46 y.o. Cody LaundryDavid Lee is a 46 y.o. right-handed Caucasian male with migraine, anxiety, stage 2 CKD, and hypertension returning to the clinic for follow-up of chronic daily headaches and left ulnar neuropathy.  The patient was accompanied to the clinic by self.  History of present illness: Starting in 2017, he had had two spells of tingling and numbness of the left hand which initially lasted 3 months, then in early May, he had numbness of the hands with associated shooting pain around the shoulder radiating down into the elbow.  The numbness lasted about 3 months.  He denies any weakness of the hands.   He has noticed that when resting his elbow on the door when driving, his numbness is triggered.  He also notices that extending his arm above his shoulder can trigger the pain.  Sometimes, he has neck pain.   He has long history of migraines starting his early teen years.  He saw neurology in high school and was started on muscle relaxants.  MRI brain at the time was normal.  Since then, he uses excedrin almost daily.  Over the past several years, his headaches have increased in intensity and frequency.  He has two types of headaches (1) dull achy daily headaches and (2) episodic migraines.  Daily headaches are dull and start from when he wakes up in the morning and lasts most of the day.  Pain is ranked as 3/10. Migraines are bitemporal throbbing pain, occur about 1-2 times per month, lasting 2-3 days.  Intensity is maximum within two hours.  He denies any nausea, vomiting, phonophobia, and only mild photophobia.  He denies any preceding vision changes, numbness/tingling, or warning signs.  Pain is ranked 9/10.   Previous preventative medications have included: topirmate, propranol, nortriptyline, none of which provided any relief.  He currently takes Excedrin tablets daily for  over 1.5 year. He has tried tylenol, Aleve, and ibuprofen which did not provide any benefit.   He had stage 2 CKD, likely from overuse of NSAIDs.   UPDATE 08/12/2017:  He was started on Trokendi 25mg  for headaches and titrated to 50mg  daily but did not appreciate any improvement in his daily headaches. Migraines are less frequent and he had 6 migraines since September and takes Relpax which helps some. He has been really great about keeping a headache dairy, which I reviewed.  Most headaches average 4-5 daily and he treats pain >7 with relpax.  His left hand tingling has improved, since being more cognizant of repositioning his arm while driving.    Medications:  Current Outpatient Medications on File Prior to Visit  Medication Sig Dispense Refill  . citalopram (CELEXA) 20 MG tablet   0  . lisinopril (PRINIVIL,ZESTRIL) 10 MG tablet   1   Current Facility-Administered Medications on File Prior to Visit  Medication Dose Route Frequency Provider Last Rate Last Dose  . triamcinolone acetonide (KENALOG) 10 MG/ML injection 10 mg  10 mg Other Once Asencion IslamStover, Titorya, DPM      . triamcinolone acetonide (KENALOG) 10 MG/ML injection 10 mg  10 mg Other Once [place]o RicoStover, Titorya, DPM      . triamcinolone acetonide (KENALOG-40) injection 20 mg  20 mg Other Once Asencion IslamStover, Titorya, DPM        Allergies: No Known Allergies  Review of Systems:  CONSTITUTIONAL: No fevers, chills, night sweats, or  weight loss.  EYES: No visual changes or eye pain ENT: No hearing changes.  No history of nose bleeds.   RESPIRATORY: No cough, wheezing and shortness of breath.   CARDIOVASCULAR: Negative for chest pain, and palpitations.   GI: Negative for abdominal discomfort, blood in stools or black stools.  No recent change in bowel habits.   GU:  No history of incontinence.   MUSCLOSKELETAL: No history of joint pain or swelling.  No myalgias.   SKIN: Negative for lesions, rash, and itching.   ENDOCRINE: Negative for cold or heat  intolerance, polydipsia or goiter.   PSYCH:  No depression +anxiety symptoms.   NEURO: As Above.   Vital Signs:  BP 130/84   Pulse 72   Ht 6\' 4"  (1.93 m)   Wt 196 lb 4 oz (89 kg)   SpO2 95%   BMI 23.89 kg/m    General: Well appearing, comfortable  Neurological Exam: MENTAL STATUS including orientation to time, place, person, recent and remote memory, attention span and concentration, language, and fund of knowledge is normal.  Speech is not dysarthric.  CRANIAL NERVES:  Face is symmetric.   MOTOR:  Motor strength is 5/5 in all extremities  COORDINATION/GAIT:   Gait narrow based and stable.   Data: NCS/EMG of the left arm 06/14/2017:  Left ulnar neuropathy with slowing across the elbow, demyelinating and axon loss in type. Overall, these findings are moderate in degree electrically.   IMPRESSION/PLAN: 1.  Medication overuse headaches. Patient has been very compliant with stopping NSAIDs and does not treat his daily headaches any more.   2.  Chronic daily headaches, there has been no improvement in the severity or frequency of daily headaches.  He is tolerating Trokendi well, so will titrate this to 100mg  daily.  He need a PA for this medication for 2019 which my office will initiate.  Previous preventative medications have included: topirmate, propranol, nortriptyline, none of which provided any relief. I may consider gabapentin, Cymbalta, Botox, or Aimovig going forward, if no response to trokendi  3.  Episodic migraine without aura              - Previously tried:  imitrex              - Increase Relpax to 40mg  as needed for severe migraine  4.  Left ulnar neuropathy with slowing across the elbow, mild and improved.  Continue conservative therapies with preventing hyperflexion at the elbow and using the soft elbow pad   5.  Stage 2 CKD, followed by nephrology.  Check BMP  Return to clinic in 4 months   Thank you for allowing me to participate in patient's care.  If I  can answer any additional questions, I would be pleased to do so.    Sincerely,    Nareh Matzke K. Allena KatzPatel, DO

## 2017-08-12 ENCOUNTER — Other Ambulatory Visit (INDEPENDENT_AMBULATORY_CARE_PROVIDER_SITE_OTHER): Payer: Managed Care, Other (non HMO)

## 2017-08-12 ENCOUNTER — Encounter: Payer: Self-pay | Admitting: Neurology

## 2017-08-12 ENCOUNTER — Ambulatory Visit (INDEPENDENT_AMBULATORY_CARE_PROVIDER_SITE_OTHER): Payer: Managed Care, Other (non HMO) | Admitting: Neurology

## 2017-08-12 VITALS — BP 130/84 | HR 72 | Ht 76.0 in | Wt 196.2 lb

## 2017-08-12 DIAGNOSIS — R51 Headache: Secondary | ICD-10-CM | POA: Diagnosis not present

## 2017-08-12 DIAGNOSIS — G43009 Migraine without aura, not intractable, without status migrainosus: Secondary | ICD-10-CM | POA: Diagnosis not present

## 2017-08-12 DIAGNOSIS — G5622 Lesion of ulnar nerve, left upper limb: Secondary | ICD-10-CM | POA: Diagnosis not present

## 2017-08-12 DIAGNOSIS — R519 Headache, unspecified: Secondary | ICD-10-CM

## 2017-08-12 DIAGNOSIS — N182 Chronic kidney disease, stage 2 (mild): Secondary | ICD-10-CM

## 2017-08-12 LAB — BASIC METABOLIC PANEL
BUN: 14 mg/dL (ref 6–23)
CO2: 30 meq/L (ref 19–32)
Calcium: 9.2 mg/dL (ref 8.4–10.5)
Chloride: 105 mEq/L (ref 96–112)
Creatinine, Ser: 1.34 mg/dL (ref 0.40–1.50)
GFR: 61.39 mL/min (ref >60.00)
GLUCOSE: 76 mg/dL (ref 70–99)
POTASSIUM: 4 meq/L (ref 3.5–5.1)
SODIUM: 140 meq/L (ref 135–145)

## 2017-08-12 MED ORDER — TOPIRAMATE ER 100 MG PO CAP24: 100.0000 mg | ORAL_CAPSULE | Freq: Every day | ORAL | 11 refills | Status: DC

## 2017-08-12 MED ORDER — ELETRIPTAN HYDROBROMIDE 40 MG PO TABS: 40.0000 mg | ORAL_TABLET | ORAL | 5 refills | Status: DC | PRN

## 2017-08-12 NOTE — Patient Instructions (Addendum)
1.  Start Trokendi 100mg  daily 2.  Increase Relpax to 40mg  for severe migraine 3.  Check renal function  Return to clinic 4 months

## 2017-08-15 ENCOUNTER — Telehealth: Payer: Self-pay | Admitting: *Deleted

## 2017-08-15 NOTE — Telephone Encounter (Signed)
Left message for patient to call me back. 

## 2017-08-15 NOTE — Telephone Encounter (Signed)
-----   Message from [place]ale Chardonika K Patel, DO sent at 08/12/2017  4:08 PM EST ----- Please inform pt that labs are stable and look good.  Please ask him for the name of his nephrologist and fax these results. Thanks.

## 2017-08-17 ENCOUNTER — Telehealth: Payer: Self-pay | Admitting: *Deleted

## 2017-08-17 ENCOUNTER — Telehealth: Payer: Self-pay | Admitting: General Practice

## 2017-08-17 NOTE — Telephone Encounter (Signed)
See next note

## 2017-08-17 NOTE — Telephone Encounter (Signed)
Patient given results and labs faxed to Dr. Arrie Aranoladonato.

## 2017-08-17 NOTE — Telephone Encounter (Signed)
-----   Message from Donika K Patel, DO sent at 08/12/2017  4:08 PM EST ----- Please inform pt that labs are stable and look good.  Please ask him for the name of his nephrologist and fax these results. Thanks. 

## 2017-08-17 NOTE — Telephone Encounter (Signed)
Pt left a voicemail message saying he missed a call and thinks it was for the lab results, please call

## 2017-08-29 ENCOUNTER — Other Ambulatory Visit: Payer: Self-pay | Admitting: *Deleted

## 2017-08-29 MED ORDER — TOPIRAMATE ER 100 MG PO CAP24: 100.0000 mg | ORAL_CAPSULE | Freq: Every day | ORAL | 11 refills | Status: AC

## 2017-10-18 ENCOUNTER — Encounter: Payer: Self-pay | Admitting: Sports Medicine

## 2017-10-18 ENCOUNTER — Ambulatory Visit (INDEPENDENT_AMBULATORY_CARE_PROVIDER_SITE_OTHER): Payer: Managed Care, Other (non HMO) | Admitting: Sports Medicine

## 2017-10-18 DIAGNOSIS — M7751 Other enthesopathy of right foot: Secondary | ICD-10-CM

## 2017-10-18 DIAGNOSIS — M205X1 Other deformities of toe(s) (acquired), right foot: Secondary | ICD-10-CM

## 2017-10-18 DIAGNOSIS — M25571 Pain in right ankle and joints of right foot: Secondary | ICD-10-CM

## 2017-10-18 NOTE — Progress Notes (Signed)
Patient ID: Cody Lee, male   DOB: [birth year], 47 y.o.   MRN: [id number]  Subjective: Cody LaundryDavid Lee is a 47 y.o. male patient who returns to office for follow-up evaluation of right big toe joint pain. Patient requests another injection states that this has helped him the most. Reports that pain started to come back last week, especially with bending to do activities and with daughter. Patient reports that he did have 1 flare of pain on left that went away with icing. Patient denies any other pedal complaints at this time.    Patient Active Problem List   Diagnosis Date Noted  . Chronic daily headache 05/27/2017  . Medication overuse headache 05/27/2017  . Migraine without aura and without status migrainosus, not intractable 05/27/2017  . Snoring 05/27/2017    Current Outpatient Medications on File Prior to Visit  Medication Sig Dispense Refill  . citalopram (CELEXA) 20 MG tablet   0  . eletriptan (RELPAX) 40 MG tablet Take 1 tablet (40 mg total) by mouth as needed for migraine or headache. May repeat in 2 hours if headache persists or recurs. 10 tablet 5  . lisinopril (PRINIVIL,ZESTRIL) 10 MG tablet   1  . Topiramate ER (TROKENDI XR) 100 MG CP24 Take 100 mg by mouth daily. 30 capsule 11   Current Facility-Administered Medications on File Prior to Visit  Medication Dose Route Frequency Provider Last Rate Last Dose  . triamcinolone acetonide (KENALOG) 10 MG/ML injection 10 mg  10 mg Other Once Asencion IslamStover, Lakeyshia Tuckerman, DPM      . triamcinolone acetonide (KENALOG) 10 MG/ML injection 10 mg  10 mg Other Once ScandiaStover, Marcellas Marchant, DPM      . triamcinolone acetonide (KENALOG-40) injection 20 mg  20 mg Other Once Asencion IslamStover, Tameron Lama, DPM        No Known Allergies  Objective:  General: Alert and oriented x3 in no acute distress  Dermatology: No open lesions bilateral lower extremities, no webspace macerations, no ecchymosis bilateral, all nails x 10 are well manicured.  Vascular: Dorsalis Pedis and  Posterior Tibial pedal pulses palpable, Capillary Fill Time 3 seconds,(+) pedal hair growth bilateral, no edema bilateral lower extremities, Temperature gradient within normal limits.  Neurology: Gross sensation intact via light touch bilateral, (- )Tinels sign bilateral.   Musculoskeletal: Mild tenderness with palpation at right 1st MTPJ with mild pain on end range plantarflexion has 10deg dorsiflexion and 5 deg plantarflexion on right, No pain to Left, No pain with calf compression bilateral. Strength within normal limits in all groups bilateral.   Assessment and Plan: Problem List Items Addressed This Visit    None    Visit Diagnoses    Capsulitis of toe, right    -  Primary   Hallux limitus of right foot       Toe joint pain, right          -Complete examination performed -Re-Discussed treatement options and longterm plan of care for hallux limitus/rigidus with arthritis -After oral consent and aseptic prep, injected a mixture containing 1 ml of 2% plain lidocaine, 1 ml 0.5% plain marcaine, 0.5 ml of kenalog 10 and 0.5 ml of dexamethasone phosphate into Right 1st MTPJ without complication. Post-injection care discussed with patient.  -Recommend daily icing and gentle range of motion exercises as tolerated -Recommend good supportive shoes for foot type. Patient is no longer using inserts states did not help. -Advised patient again to consider surgery if this current treatment plan fails to offer any relief due to patient's high  functional status with running/sports/tennis/ADLs. Re-discussed cheilectomy with decompression first metatarsal osteotomy.  -Patient to return to office as needed or sooner if condition worsens.  Asencion Islam, DPM

## 2017-12-06 NOTE — Progress Notes (Signed)
Follow-up Visit   Date: 12/07/17    Cody Lee MRN: [id number] DOB: April 17, 1971   Interim History: Cody Lee is a 47 y.o. Cody Lee is a 47 y.o. right-handed Caucasian male with migraine, anxiety, stage 2 CKD, and hypertension returning to the clinic for follow-up of chronic daily headaches and left ulnar neuropathy.  The patient was accompanied to the clinic by self.  History of present illness: Starting in 2017, he had had two spells of tingling and numbness of the left hand which initially lasted 3 months, then in early May, he had numbness of the hands with associated shooting pain around the shoulder radiating down into the elbow.  The numbness lasted about 3 months.  He denies any weakness of the hands.   He has noticed that when resting his elbow on the door when driving, his numbness is triggered.  He also notices that extending his arm above his shoulder can trigger the pain.  Sometimes, he has neck pain.   He has long history of migraines starting his early teen years.  He saw neurology in high school and was started on muscle relaxants.  MRI brain at the time was normal.  Since then, he uses excedrin almost daily.  Over the past several years, his headaches have increased in intensity and frequency.  He has two types of headaches (1) dull achy daily headaches and (2) episodic migraines.  Daily headaches are dull and start from when he wakes up in the morning and lasts most of the day.  Pain is ranked as 3/10. Migraines are bitemporal throbbing pain, occur about 1-2 times per month, lasting 2-3 days.  Intensity is maximum within two hours.  He denies any nausea, vomiting, phonophobia, and only mild photophobia.  He denies any preceding vision changes, numbness/tingling, or warning signs.  Pain is ranked 9/10.   Previous preventative medications have included: topirmate, propranol, nortriptyline, none of which provided any relief.  He currently takes Excedrin tablets daily for  over 1.5 year. He has tried tylenol, Aleve, and ibuprofen which did not provide any benefit.   He had stage 2 CKD, likely from overuse of NSAIDs.   UPDATE 08/12/2017:  He was started on Trokendi 25mg  for headaches and titrated to 50mg  daily but did not appreciate any improvement in his daily headaches. Migraines are less frequent and he had 6 migraines since September and takes Relpax which helps some. He has been really great about keeping a headache dairy, which I reviewed.  Most headaches average 4-5 daily and he treats pain >7 with relpax.  His left hand tingling has improved, since being more cognizant of repositioning his arm while driving.    UPDATE 12/06/2017:   He is taking Trokendi 100mg  and is tolerating it well.  He gets migraines once every few weeks, which is less intense (4/10) and continues to last 2-3 days.  He has taken Relpax 3 times since he was last here.  He continues to have daily headaches, described as dull headaches ranking from 2-4/10.  He does not treat these low grade headaches and able to function.    Medications:  Current Outpatient Medications on File Prior to Visit  Medication Sig Dispense Refill  . citalopram (CELEXA) 20 MG tablet   0  . eletriptan (RELPAX) 40 MG tablet Take 1 tablet (40 mg total) by mouth as needed for migraine or headache. May repeat in 2 hours if headache persists or recurs. 10 tablet 5  . lisinopril (PRINIVIL,ZESTRIL)  10 MG tablet   1  . Topiramate ER (TROKENDI XR) 100 MG CP24 Take 100 mg by mouth daily. 30 capsule 11   Current Facility-Administered Medications on File Prior to Visit  Medication Dose Route Frequency Provider Last Rate Last Dose  . triamcinolone acetonide (KENALOG) 10 MG/ML injection 10 mg  10 mg Other Once Asencion IslamStover, Titorya, DPM      . triamcinolone acetonide (KENALOG) 10 MG/ML injection 10 mg  10 mg Other Once [place]streetStover, Titorya, DPM      . triamcinolone acetonide (KENALOG-40) injection 20 mg  20 mg Other Once Asencion IslamStover, Titorya, DPM         Allergies: No Known Allergies  Review of Systems:  CONSTITUTIONAL: No fevers, chills, night sweats, or weight loss.  EYES: No visual changes or eye pain ENT: No hearing changes.  No history of nose bleeds.   RESPIRATORY: No cough, wheezing and shortness of breath.   CARDIOVASCULAR: Negative for chest pain, and palpitations.   GI: Negative for abdominal discomfort, blood in stools or black stools.  No recent change in bowel habits.   GU:  No history of incontinence.   MUSCLOSKELETAL: No history of joint pain or swelling.  No myalgias.   SKIN: Negative for lesions, rash, and itching.   ENDOCRINE: Negative for cold or heat intolerance, polydipsia or goiter.   PSYCH:  No depression +anxiety symptoms.   NEURO: As Above.   Vital Signs:  BP 128/90   Pulse (!) 54   Ht 6\' 4"  (1.93 m)   Wt 202 lb 8 oz (91.9 kg)   SpO2 97%   BMI 24.65 kg/m    General: Well appearing, comfortable  Neurological Exam: MENTAL STATUS including orientation to time, place, person, recent and remote memory, attention span and concentration, language, and fund of knowledge is normal.  Speech is not dysarthric.  CRANIAL NERVES:  Face is symmetric.   MOTOR:  Motor strength is 5/5 in all extremities  COORDINATION/GAIT:   Gait narrow based and stable.   Data: NCS/EMG of the left arm 06/14/2017:  Left ulnar neuropathy with slowing across the elbow, demyelinating and axon loss in type. Overall, these findings are moderate in degree electrically.   IMPRESSION/PLAN: 1.  Chronic daily headaches, unfortunately, there has been no improvement in the frequency of intensity of these headaches.    - Previous preventative medications have included: topirmate, propranol, nortriptyline, none of which provided any relief.  - Will start prior authorization for Ajovy  - Continue Trokendi 100mg  daily  - [facility]  2.  Episodic migraine with aura.  Previously tried:   imitrex  - Continue relpax 40mg    - Samples provided for zomig 5mg  nasal spray.  If this is effective, I will send a prescription  3.  Left cubital tunnel syndrome, mild - resolved with conservative therapies.  4.  Stage 2 CKD, followed by nephrology.     Return to clinic in 5 months  Greater than 50% of this 25 minute visit was spent in counseling, explanation of diagnosis, planning of further management, and coordination of care.    Thank you for allowing me to participate in patient's care.  If I can answer any additional questions, I would be pleased to do so.    Sincerely,    Abdulloh Ullom K. Allena KatzPatel, DO

## 2017-12-07 ENCOUNTER — Encounter: Payer: Self-pay | Admitting: Neurology

## 2017-12-07 ENCOUNTER — Ambulatory Visit (INDEPENDENT_AMBULATORY_CARE_PROVIDER_SITE_OTHER): Payer: Managed Care, Other (non HMO) | Admitting: Neurology

## 2017-12-07 VITALS — BP 128/90 | HR 54 | Ht 76.0 in | Wt 202.5 lb

## 2017-12-07 DIAGNOSIS — R51 Headache: Secondary | ICD-10-CM | POA: Diagnosis not present

## 2017-12-07 DIAGNOSIS — R519 Headache, unspecified: Secondary | ICD-10-CM

## 2017-12-07 DIAGNOSIS — G43009 Migraine without aura, not intractable, without status migrainosus: Secondary | ICD-10-CM | POA: Diagnosis not present

## 2017-12-07 MED ORDER — ZOLMITRIPTAN 5 MG NA SOLN: 1.0000 | NASAL | 0 refills | Status: DC | PRN

## 2017-12-07 NOTE — Addendum Note (Signed)
Addended by: Vivien RotaRIVER, Sarinity Dicicco H on: 1/61/09604/05/2018 12:05 PM   Modules accepted: Orders

## 2017-12-07 NOTE — Patient Instructions (Signed)
Please review information on Ajovy.  My office will start authorization for this medication Continue Trokendi 100mg  daily For severe migraine, try Zomig nasal spray [facility] Return to clinic in 5 months

## 2017-12-09 ENCOUNTER — Other Ambulatory Visit: Payer: Self-pay | Admitting: *Deleted

## 2017-12-09 MED ORDER — FREMANEZUMAB-VFRM 225 MG/1.5ML ~~LOC~~ SOSY: PREFILLED_SYRINGE | SUBCUTANEOUS | Status: DC

## 2018-01-02 ENCOUNTER — Ambulatory Visit: Payer: Managed Care, Other (non HMO) | Admitting: Neurology

## 2018-03-31 ENCOUNTER — Ambulatory Visit (INDEPENDENT_AMBULATORY_CARE_PROVIDER_SITE_OTHER): Payer: Managed Care, Other (non HMO) | Admitting: Sports Medicine

## 2018-03-31 ENCOUNTER — Encounter: Payer: Self-pay | Admitting: Sports Medicine

## 2018-03-31 DIAGNOSIS — M7751 Other enthesopathy of right foot: Secondary | ICD-10-CM

## 2018-03-31 DIAGNOSIS — M25571 Pain in right ankle and joints of right foot: Secondary | ICD-10-CM

## 2018-03-31 DIAGNOSIS — M205X1 Other deformities of toe(s) (acquired), right foot: Secondary | ICD-10-CM

## 2018-03-31 DIAGNOSIS — M779 Enthesopathy, unspecified: Secondary | ICD-10-CM | POA: Diagnosis not present

## 2018-03-31 DIAGNOSIS — B359 Dermatophytosis, unspecified: Secondary | ICD-10-CM

## 2018-03-31 MED ORDER — CLOTRIMAZOLE 1 % EX SOLN: 1.0000 "application " | Freq: Two times a day (BID) | CUTANEOUS | 5 refills | Status: DC

## 2018-03-31 MED ORDER — TRIAMCINOLONE ACETONIDE 40 MG/ML IJ SUSP: 20.0000 mg | Freq: Once | INTRAMUSCULAR | Status: AC

## 2018-03-31 MED ORDER — KETOCONAZOLE 2 % EX CREA: 1.0000 "application " | TOPICAL_CREAM | Freq: Every day | CUTANEOUS | 5 refills | Status: DC

## 2018-03-31 NOTE — Progress Notes (Signed)
Patient ID: Cody Lee, male   DOB: 1971/05/17, 47 y.o.   MRN: [id number]  Subjective: Cody Lee is a 47 y.o. male patient who returns to office for follow-up evaluation of right big toe joint pain. Patient requests another injection states that this has helped him the most however seems like last injection did not last as long as the one before states that pain is 4 out of 10 sharp in nature and states that it was bad in June however he was able to get it calmed down with icing.  Patient states that he is concerned about thickening and cracking of the left great toenail it falls off occasionally and is also concerned about cracked and dry skin on the bottom of the left foot.  Patient states that this flares up and has been present for about 1 year off and on but always been on the left foot.  States that he has tried athletes cream but still has had issues with this coming states that he had oral Lamisil in the past however he cannot take any medications that will affect his kidneys because he is now having issues with his kidneys.  Denies any redness, warmth, itching or any other acute symptoms at this time.  Patient denies any other pedal complaints at this time.    Patient Active Problem List   Diagnosis Date Noted  . Chronic daily headache 05/27/2017  . Migraine without aura and without status migrainosus, not intractable 05/27/2017  . Snoring 05/27/2017    Current Outpatient Medications on File Prior to Visit  Medication Sig Dispense Refill  . citalopram (CELEXA) 20 MG tablet   0  . eletriptan (RELPAX) 40 MG tablet Take 1 tablet (40 mg total) by mouth as needed for migraine or headache. May repeat in 2 hours if headache persists or recurs. 10 tablet 5  . lisinopril (PRINIVIL,ZESTRIL) 10 MG tablet   1  . Topiramate ER (TROKENDI XR) 100 MG CP24 Take 100 mg by mouth daily. 30 capsule 11  . zolmitriptan (ZOMIG) 5 MG nasal solution Place 1 spray into the nose as needed for migraine. Lot:  [id number] Exp: 01-2018 6 Units 0   Current Facility-Administered Medications on File Prior to Visit  Medication Dose Route Frequency Provider Last Rate Last Dose  . Fremanezumab-vfrm SOSY   Subcutaneous Q30 days Allena KatzPatel, Donika K, DO      . triamcinolone acetonide (KENALOG) 10 MG/ML injection 10 mg  10 mg Other Once PiedmontStover, Channel Papandrea, DPM      . triamcinolone acetonide (KENALOG) 10 MG/ML injection 10 mg  10 mg Other Once Gurleen Larrivee, DPM      . triamcinolone acetonide (KENALOG-40) injection 20 mg  20 mg Other Once Asencion IslamStover, Yanette Tripoli, DPM        No Known Allergies  Objective:  General: Alert and oriented x3 in no acute distress  Dermatology: Scaly lesions and moccasin distribution focal to the left foot there is yellowing and thickening of the left first and fifth toenails however nails are well attached, no open lesions bilateral lower extremities, no webspace macerations, no ecchymosis bilateral.  Vascular: Dorsalis Pedis and Posterior Tibial pedal pulses palpable, Capillary Fill Time 3 seconds,(+) pedal hair growth bilateral, no edema bilateral lower extremities, Temperature gradient within normal limits.  Neurology: [name] sensation intact via light touch bilateral, (- )Tinels sign bilateral.   Musculoskeletal: Mild tenderness with palpation at right 1st MTPJ with mild pain on end range plantarflexion has 10deg dorsiflexion and 5 deg plantarflexion on  right as previous, No pain to Left, No pain with calf compression bilateral. Strength within normal limits in all groups bilateral.   Assessment and Plan: Problem List Items Addressed This Visit    None    Visit Diagnoses    Capsulitis of toe, right    -  Primary   Hallux limitus of right foot       Toe joint pain, right          -Complete examination performed -Re-Discussed treatement options and longterm plan of care for hallux limitus/rigidus with arthritis -After oral consent and aseptic prep, injected a mixture containing 1 ml of 2%  plain lidocaine, 1 ml 0.5% plain marcaine, 0.5 ml of kenalog 40 and 0.5 ml of dexamethasone phosphate into Right 1st MTPJ without complication. Post-injection care discussed with patient.  -Recommend daily icing and gentle range of motion exercises as tolerated -Recommend good supportive shoes for foot type. Patient is no longer using inserts states did not help. -Advised patient again to consider surgery if this current treatment plan fails to offer any relief due to patient's high functional status with running/sports/tennis/ADLs. Re-discussed cheilectomy with decompression first metatarsal osteotomy, patient continues to decline.  -Discussed with patient treatment options for tinea -Prescribed ketoconazole cream and clotrimazole solution and advised patient that it will take time in consistency to see results -Advised patient on good hygiene habits -Patient to return to office as needed or sooner if condition worsens.  Asencion Islam, DPM

## 2018-05-15 ENCOUNTER — Ambulatory Visit (INDEPENDENT_AMBULATORY_CARE_PROVIDER_SITE_OTHER): Payer: Managed Care, Other (non HMO) | Admitting: Neurology

## 2018-05-15 ENCOUNTER — Encounter: Payer: Self-pay | Admitting: Neurology

## 2018-05-15 VITALS — BP 100/70 | HR 70 | Ht 76.0 in | Wt 202.4 lb

## 2018-05-15 DIAGNOSIS — G43009 Migraine without aura, not intractable, without status migrainosus: Secondary | ICD-10-CM | POA: Diagnosis not present

## 2018-05-15 DIAGNOSIS — R519 Headache, unspecified: Secondary | ICD-10-CM

## 2018-05-15 DIAGNOSIS — G5622 Lesion of ulnar nerve, left upper limb: Secondary | ICD-10-CM | POA: Diagnosis not present

## 2018-05-15 DIAGNOSIS — R51 Headache: Secondary | ICD-10-CM

## 2018-05-15 MED ORDER — FREMANEZUMAB-VFRM 225 MG/1.5ML ~~LOC~~ SOSY: 1.0000 "pen " | PREFILLED_SYRINGE | SUBCUTANEOUS | Status: DC

## 2018-05-15 NOTE — Patient Instructions (Signed)
Start Ajovy monthly injections  Samples provided for Migranal nasal spray.  You can try this for severe migraine.  Limit to twice per week.  If you have benefit, please call my office and we can send a prescription  Continue Trokendi 100mg  daily  Return to clinic in January and we can decide how to reduce Trokendi.

## 2018-05-15 NOTE — Progress Notes (Signed)
Follow-up Visit   Date: 05/15/18    Cody Lee MRN: [id number] DOB: [birth year]   Interim History: Cody Lee is a 47 y.o. right-handed Caucasian male with migraine, anxiety, stage 2 CKD, and hypertension returning to the clinic for follow-up of chronic daily headaches and left ulnar neuropathy.  The patient was accompanied to the clinic by self.  History of present illness: Starting in 2017, he had had two spells of tingling and numbness of the left hand which initially lasted 3 months, then in early May, he had numbness of the hands with associated shooting pain around the shoulder radiating down into the elbow.  He denies any weakness of the hands. He has noticed that when resting his elbow on the door when driving, his numbness is triggered.  He also notices that extending his arm above his shoulder can trigger the pain.  NCS/EMG in October 2018 confirmed left ulnar neuropathy. Symptoms are stable with conservative therapies.   He has long history of migraines starting his early teen years.  He saw neurology in high school and was started on muscle relaxants.  MRI brain at the time was normal.  Since then, he uses excedrin almost daily.  Over the past several years, his headaches have increased in intensity and frequency.  He has two types of headaches (1) dull achy daily headaches and (2) episodic migraines.  Daily headaches are dull and start from when he wakes up in the morning and lasts most of the day.  Pain is ranked as 3/10. Migraines are bitemporal throbbing pain, occur about 1-2 times per month, lasting 2-3 days.  Intensity is maximum within two hours.  He denies any nausea, vomiting, phonophobia, and only mild photophobia.  He denies any preceding vision changes, numbness/tingling, or warning signs.  Pain is ranked 9/10.   Previous preventative medications have included: topirmate, propranol, nortriptyline, none of which provided any relief.  He currently takes Excedrin  tablets daily for over 1.5 year. He has tried tylenol, Aleve, and ibuprofen which did not provide any benefit.   He had stage 2 CKD, likely from overuse of NSAIDs.   UPDATE 08/12/2017:  He was started on Trokendi 25mg  for headaches and titrated to 50mg  daily but did not appreciate any improvement in his daily headaches. Migraines are less frequent and he had 6 migraines since September and takes Relpax which helps some.   UPDATE 12/06/2017:   He is taking Trokendi 100mg  and is tolerating it well.  He gets migraines once every few weeks, which is less intense (4/10) and continues to last 2-3 days.  He has taken Relpax 3 times since he was last here.  He continues to have daily headaches, described as dull headaches ranking from 2-4/10.  He does not treat these low grade headaches and able to function.    UPDATE 05/15/2018:  Starting in June, he began having worsening intensity of headaches (7-10/10) and is having daily headache (3/10).  He does not get relief with Relpax, so stopped taking this and is not taking anything OTC.  He does not endorse nausea or vomiting.   He is taking trokendi 100mg /d and tolerating this.  At his last visit, we discussed started Ajovy, but he did not pick up the prescription for this.  He expresses interest in starting this now.   Medications:  Current Outpatient Medications on File Prior to Visit  Medication Sig Dispense Refill  . citalopram (CELEXA) 20 MG tablet   0  . clotrimazole (  LOTRIMIN) 1 % cream APPLY IN BETWEEN TOES AND TO TOENAILS TWICE DAILY  5  . eletriptan (RELPAX) 40 MG tablet Take 1 tablet (40 mg total) by mouth as needed for migraine or headache. May repeat in 2 hours if headache persists or recurs. 10 tablet 5  . lisinopril (PRINIVIL,ZESTRIL) 10 MG tablet   1  . Topiramate ER (TROKENDI XR) 100 MG CP24 Take 100 mg by mouth daily. 30 capsule 11  . zolmitriptan (ZOMIG) 5 MG nasal solution Place 1 spray into the nose as needed for migraine. Lot: [id number] Exp:  01-2018 6 Units 0   Current Facility-Administered Medications on File Prior to Visit  Medication Dose Route Frequency Provider Last Rate Last Dose  . Fremanezumab-vfrm SOSY   Subcutaneous Q30 days Allena Katz, Mileydi Milsap K, DO      . triamcinolone acetonide (KENALOG) 10 MG/ML injection 10 mg  10 mg Other Once Asencion Islam, DPM      . triamcinolone acetonide (KENALOG) 10 MG/ML injection 10 mg  10 mg Other Once [place], Titorya, DPM      . triamcinolone acetonide (KENALOG-40) injection 20 mg  20 mg Other Once [place], Titorya, DPM      . triamcinolone acetonide (KENALOG-40) injection 20 mg  20 mg Other Once Asencion Islam, DPM        Allergies: No Known Allergies  Review of Systems:  CONSTITUTIONAL: No fevers, chills, night sweats, or weight loss.  EYES: No visual changes or eye pain ENT: No hearing changes.  No history of nose bleeds.   RESPIRATORY: No cough, wheezing and shortness of breath.   CARDIOVASCULAR: Negative for chest pain, and palpitations.   GI: Negative for abdominal discomfort, blood in stools or black stools.  No recent change in bowel habits.   GU:  No history of incontinence.   MUSCLOSKELETAL: No history of joint pain or swelling.  No myalgias.   SKIN: Negative for lesions, rash, and itching.   ENDOCRINE: Negative for cold or heat intolerance, polydipsia or goiter.   PSYCH:  No depression +anxiety symptoms.   NEURO: As Above.   Vital Signs:  BP 100/70   Pulse 70   Ht 6\' 4"  (1.93 m)   Wt 202 lb 6 oz (91.8 kg)   SpO2 96%   BMI 24.63 kg/m   General Medical Exam:   General:  Well appearing, comfortable  Eyes/ENT: see cranial nerve examination.   Neck: No masses appreciated.  Full range of motion without tenderness.  No carotid bruits. Respiratory:  Clear to auscultation, good air entry bilaterally.   Cardiac:  Regular rate and rhythm, no murmur.   Ext:  No edema  Neurological Exam: MENTAL STATUS including orientation to time, place, person, recent and remote memory,  attention span and concentration, language, and fund of knowledge is normal.  Speech is not dysarthric.  CRANIAL NERVES: Pupils are round and reactive.  extraocular muscles intact.  Face is symmetric. Tongue is midline.   MOTOR:  Motor strength is 5/5 in all extremities.  No pronator drift  REFLEXES:  Symmetric 2+/4 throughout.  COORDINATION/GAIT:  No dysmetria.  Gait narrow based and stable.   Data: NCS/EMG of the left arm 06/14/2017:  Left ulnar neuropathy with slowing across the elbow, demyelinating and axon loss in type. Overall, these findings are moderate in degree electrically.   IMPRESSION/PLAN: 1.  Chronic daily headaches, worsening.  - Previous preventative medications have included: topirmate, propranol, nortriptyline, none of which provided any relief.  - Start monthly Ajovy with initial  injection today  - Continue Trokendi 100mg  daily.  He continues be denied by his insurance for Trokendi, due to not offering a trial of depakote; if Adjovy is effective, plan to taper Trokendi  2.  Episodic migraine with aura.  Previously tried:  Imitrex, zomig, Relpax    - Sample provided for migranol. If this is effective, I will send a prescription  3.  Left cubital tunnel syndrome, mild with intermittent symptoms.  Improved with conservative therapies  4.  Stage 2 CKD, followed by nephrology.     Return to clinic in 4 months   Thank you for allowing me to participate in patient's care.  If I can answer any additional questions, I would be pleased to do so.    Sincerely,    Akacia Boltz K. Allena Katz, DO

## 2018-06-12 ENCOUNTER — Telehealth: Payer: Self-pay | Admitting: Neurology

## 2018-06-12 ENCOUNTER — Other Ambulatory Visit: Payer: Self-pay | Admitting: *Deleted

## 2018-06-12 MED ORDER — FREMANEZUMAB-VFRM 225 MG/1.5ML ~~LOC~~ SOSY: 225.0000 mg | PREFILLED_SYRINGE | SUBCUTANEOUS | 5 refills | Status: DC

## 2018-06-12 NOTE — Telephone Encounter (Signed)
Patient is calling in stating that he went to check on his Ajovy prescription and it is older. It was lost within the switch from [facility] to [facility]. He needs a new prescription of it sent to the [facility] in  [place]. If you need to call him it's [phone]. Thanks!

## 2018-06-12 NOTE — Telephone Encounter (Signed)
Rx sent 

## 2018-07-24 NOTE — Progress Notes (Signed)
PA initiated via [email or web address] for pt's  Trokendi XR 100MG  er capsules

## 2018-08-22 ENCOUNTER — Other Ambulatory Visit: Payer: Self-pay | Admitting: Neurology

## 2018-08-24 ENCOUNTER — Other Ambulatory Visit: Payer: Self-pay | Admitting: Neurology

## 2018-09-07 NOTE — Progress Notes (Signed)
Prior Authorization initiated via [email or web address] for pt's   AJOVY 225MG /1.34mL

## 2018-09-11 NOTE — Progress Notes (Signed)
Received notice from [email or web address] that pt's   AJOVY has been approved thru 03/11/2019

## 2018-09-13 ENCOUNTER — Telehealth: Payer: Self-pay | Admitting: *Deleted

## 2018-09-13 NOTE — Telephone Encounter (Signed)
Ajovy approved 09/11/2018 through 03/11/2019. Request ID: [id number]

## 2018-09-14 NOTE — Progress Notes (Signed)
Follow-up Visit   Date: 09/15/18    Cody Lee MRN: [id number] DOB: Oct 06, 1970   Interim History: Cody Lee is a 48 y.o. right-handed Caucasian male with migraine, anxiety, stage 2 CKD, and hypertension returning to the clinic for follow-up of chronic daily headaches and left ulnar neuropathy.  The patient was accompanied to the clinic by self.  History of present illness: Starting in 2017, he had had two spells of tingling and numbness of the left hand which initially lasted 3 months, then in early May, he had numbness of the hands with associated shooting pain around the shoulder radiating down into the elbow.  He denies any weakness of the hands. He has noticed that when resting his elbow on the door when driving, his numbness is triggered.  He also notices that extending his arm above his shoulder can trigger the pain.  NCS/EMG in October 2018 confirmed left ulnar neuropathy. Symptoms are stable with conservative therapies.   He has long history of migraines starting his early teen years.  He saw neurology in high school and was started on muscle relaxants.  MRI brain at the time was normal.  Since then, he uses excedrin almost daily.  Over the past several years, his headaches have increased in intensity and frequency.  He has two types of headaches (1) dull achy daily headaches and (2) episodic migraines.  Daily headaches are dull and start from when he wakes up in the morning and lasts most of the day.  Pain is ranked as 3/10. Migraines are bitemporal throbbing pain, occur about 1-2 times per month, lasting 2-3 days.  Intensity is maximum within two hours.  He denies any nausea, vomiting, phonophobia, and only mild photophobia.  He denies any preceding vision changes, numbness/tingling, or warning signs.  Pain is ranked 9/10.   Previous preventative medications have included: topirmate, propranol, nortriptyline, none of which provided any relief.  He currently takes Excedrin  tablets daily for over 1.5 year. He has tried tylenol, Aleve, and ibuprofen which did not provide any benefit.   He had stage 2 CKD, likely from overuse of NSAIDs.     UPDATE 12/06/2017:   He is taking Trokendi 100mg  and is tolerating it well.  He gets migraines once every few weeks, which is less intense (4/10) and continues to last 2-3 days.  He has taken Relpax 3 times since he was last here.  He continues to have daily headaches, described as dull headaches ranking from 2-4/10.  He does not treat these low grade headaches and able to function.    UPDATE 05/15/2018:  Starting in June, he began having worsening intensity of headaches (7-10/10) and is having daily headache (3/10).  He does not get relief with Relpax, so stopped taking this and is not taking anything OTC.  He does not endorse nausea or vomiting.   He is taking trokendi 100mg /d and tolerating this.  At his last visit, we discussed started Ajovy, but he did not pick up the prescription for this.  He expresses interest in starting this now.   UPDATE 09/14/2018:  He was started on Ajovy at his last visit and reports no change in headaches.  He got a few days of relief with his first injection.  He discontinue Trokendi in the end of December and has not noticed any worsening intensity of headaches.  He sometimes feels like stopping all the medications because there is no benefit.  There was no benefit with migranol either.  He does not treat migraines with OTC medications and tries to function through the pain.     Medications:  Current Outpatient Medications on File Prior to Visit  Medication Sig Dispense Refill  . citalopram (CELEXA) 20 MG tablet Take 10 mg by mouth daily.   0  . clotrimazole (LOTRIMIN) 1 % cream APPLY IN BETWEEN TOES AND TO TOENAILS TWICE DAILY  5  . lisinopril (PRINIVIL,ZESTRIL) 10 MG tablet   1  . Topiramate ER (TROKENDI XR) 100 MG CP24 Take 100 mg by mouth daily. 30 capsule 11   Current Facility-Administered Medications  on File Prior to Visit  Medication Dose Route Frequency Provider Last Rate Last Dose  . triamcinolone acetonide (KENALOG) 10 MG/ML injection 10 mg  10 mg Other Once Asencion IslamStover, Titorya, DPM      . triamcinolone acetonide (KENALOG) 10 MG/ML injection 10 mg  10 mg Other Once [place]ver, Titorya, DPM      . triamcinolone acetonide (KENALOG-40) injection 20 mg  20 mg Other Once [place], Titory[place], DPM      . triamcinolone acetonide (KENALOG-40) injection 20 mg  20 mg Other Once Asencion IslamStover, Titorya, DPM        Allergies: No Known Allergies  Review of Systems:  CONSTITUTIONAL: No fevers, chills, night sweats, or weight loss.  EYES: No visual changes or eye pain ENT: No hearing changes.  No history of nose bleeds.   RESPIRATORY: No cough, wheezing and shortness of breath.   CARDIOVASCULAR: Negative for chest pain, and palpitations.   GI: Negative for abdominal discomfort, blood in stools or black stools.  No recent change in bowel habits.   GU:  No history of incontinence.   MUSCLOSKELETAL: No history of joint pain or swelling.  No myalgias.   SKIN: Negative for lesions, rash, and itching.   ENDOCRINE: Negative for cold or heat intolerance, polydipsia or goiter.   PSYCH:  No depression +anxiety symptoms.   NEURO: As Above.   Vital Signs:  BP (!) 140/100   Pulse 64   Ht 6\' 4"  (1.93 m)   Wt 215 lb 4 oz (97.6 kg)   SpO2 99%   BMI 26.20 kg/m   General Medical Exam:   General:  Well appearing, comfortable  Eyes/ENT: see cranial nerve examination.   Neck:   No carotid bruits. Respiratory:  Clear to auscultation, good air entry bilaterally.   Cardiac:  Regular rate and rhythm, no murmur.   Ext:  No edema  Neurological Exam: MENTAL STATUS including orientation to time, place, person, recent and remote memory, attention span and concentration, language, and fund of knowledge is normal.  Speech is not dysarthric.  CRANIAL NERVES: Pupils are round and reactive.  Extraocular muscles intact.  Face is  symmetric.   MOTOR:  Motor strength is 5/5 in all extremities.  No pronator drift  COORDINATION/GAIT:  No dysmetria.  Gait narrow based and stable.   Data: NCS/EMG of the left arm 06/14/2017:  Left ulnar neuropathy with slowing across the elbow, demyelinating and axon loss in type. Overall, these findings are moderate in degree electrically.   IMPRESSION/PLAN: 1.  Chronic daily headaches, still with daily headaches and migraines occurring several times per month.  - Previous preventative medications have included: topirmate, Trokendi, propranol, nortriptyline, none of which provided any relief.  - Discontinue monthly Ajovy due to no improvement after 3 month period  - Start zonisamide 25mg    - Consider Botox going forward  2.  Episodic migraine.  Previously tried:  Imitrex, zomig,  Relpax, Migranol  - Start maxalt 10mg  at headache onset   3.  Left cubital tunnel syndrome, mild with intermittent symptoms.  Improved with conservative therapies  4.  Stage 2 CKD, followed by nephrology.     Return to clinic in 6 months   Thank you for allowing me to participate in patient's care.  If I can answer any additional questions, I would be pleased to do so.    Sincerely,    Murel Shenberger K. Allena KatzPatel, DO

## 2018-09-15 ENCOUNTER — Ambulatory Visit (INDEPENDENT_AMBULATORY_CARE_PROVIDER_SITE_OTHER): Payer: Managed Care, Other (non HMO) | Admitting: Neurology

## 2018-09-15 ENCOUNTER — Encounter: Payer: Self-pay | Admitting: Neurology

## 2018-09-15 VITALS — BP 140/100 | HR 64 | Ht 76.0 in | Wt 215.2 lb

## 2018-09-15 DIAGNOSIS — G5622 Lesion of ulnar nerve, left upper limb: Secondary | ICD-10-CM

## 2018-09-15 DIAGNOSIS — G43009 Migraine without aura, not intractable, without status migrainosus: Secondary | ICD-10-CM | POA: Diagnosis not present

## 2018-09-15 MED ORDER — RIZATRIPTAN BENZOATE 10 MG PO TABS: 10.0000 mg | ORAL_TABLET | ORAL | 3 refills | Status: AC | PRN

## 2018-09-15 MED ORDER — ZONISAMIDE 25 MG PO CAPS: 25.0000 mg | ORAL_CAPSULE | Freq: Every day | ORAL | 5 refills | Status: AC

## 2018-09-15 NOTE — Patient Instructions (Signed)
Start zonisamide 25mg  daily   Stop trokendi and Ajovy  For severe headache, try maxalt 10mg  at headache onset  Return to clinic in 4 months

## 2019-01-15 ENCOUNTER — Ambulatory Visit: Payer: Managed Care, Other (non HMO) | Admitting: Neurology

## 2019-03-28 ENCOUNTER — Ambulatory Visit: Payer: Managed Care, Other (non HMO) | Admitting: Neurology
